# Patient Record
Sex: Female | Born: 2005 | Race: White | Hispanic: No | Marital: Single | Smoking: Never smoker
Health system: Southern US, Community
[De-identification: ages and names within clinical notes are randomized; demographics above are authoritative.]

---
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
## 2024-01-30 DIAGNOSIS — M9904 Segmental and somatic dysfunction of sacral region: Secondary | ICD-10-CM | POA: Diagnosis not present

## 2024-01-30 DIAGNOSIS — M9905 Segmental and somatic dysfunction of pelvic region: Secondary | ICD-10-CM | POA: Diagnosis not present

## 2024-01-30 DIAGNOSIS — M9903 Segmental and somatic dysfunction of lumbar region: Secondary | ICD-10-CM | POA: Diagnosis not present

## 2024-01-30 DIAGNOSIS — M9902 Segmental and somatic dysfunction of thoracic region: Secondary | ICD-10-CM | POA: Diagnosis not present

## 2024-02-02 DIAGNOSIS — M9904 Segmental and somatic dysfunction of sacral region: Secondary | ICD-10-CM | POA: Diagnosis not present

## 2024-02-02 DIAGNOSIS — M9903 Segmental and somatic dysfunction of lumbar region: Secondary | ICD-10-CM | POA: Diagnosis not present

## 2024-02-02 DIAGNOSIS — M9902 Segmental and somatic dysfunction of thoracic region: Secondary | ICD-10-CM | POA: Diagnosis not present

## 2024-02-02 DIAGNOSIS — M9905 Segmental and somatic dysfunction of pelvic region: Secondary | ICD-10-CM | POA: Diagnosis not present

## 2024-02-07 DIAGNOSIS — M9903 Segmental and somatic dysfunction of lumbar region: Secondary | ICD-10-CM | POA: Diagnosis not present

## 2024-02-07 DIAGNOSIS — M9905 Segmental and somatic dysfunction of pelvic region: Secondary | ICD-10-CM | POA: Diagnosis not present

## 2024-02-07 DIAGNOSIS — M9904 Segmental and somatic dysfunction of sacral region: Secondary | ICD-10-CM | POA: Diagnosis not present

## 2024-02-07 DIAGNOSIS — M9902 Segmental and somatic dysfunction of thoracic region: Secondary | ICD-10-CM | POA: Diagnosis not present

## 2024-02-21 DIAGNOSIS — M9902 Segmental and somatic dysfunction of thoracic region: Secondary | ICD-10-CM | POA: Diagnosis not present

## 2024-02-21 DIAGNOSIS — M9904 Segmental and somatic dysfunction of sacral region: Secondary | ICD-10-CM | POA: Diagnosis not present

## 2024-02-21 DIAGNOSIS — M9903 Segmental and somatic dysfunction of lumbar region: Secondary | ICD-10-CM | POA: Diagnosis not present

## 2024-02-21 DIAGNOSIS — M9905 Segmental and somatic dysfunction of pelvic region: Secondary | ICD-10-CM | POA: Diagnosis not present

## 2024-02-27 DIAGNOSIS — M9903 Segmental and somatic dysfunction of lumbar region: Secondary | ICD-10-CM | POA: Diagnosis not present

## 2024-02-27 DIAGNOSIS — M9902 Segmental and somatic dysfunction of thoracic region: Secondary | ICD-10-CM | POA: Diagnosis not present

## 2024-02-27 DIAGNOSIS — M9904 Segmental and somatic dysfunction of sacral region: Secondary | ICD-10-CM | POA: Diagnosis not present

## 2024-02-27 DIAGNOSIS — M9905 Segmental and somatic dysfunction of pelvic region: Secondary | ICD-10-CM | POA: Diagnosis not present

## 2024-03-06 DIAGNOSIS — M9902 Segmental and somatic dysfunction of thoracic region: Secondary | ICD-10-CM | POA: Diagnosis not present

## 2024-03-06 DIAGNOSIS — M9903 Segmental and somatic dysfunction of lumbar region: Secondary | ICD-10-CM | POA: Diagnosis not present

## 2024-03-06 DIAGNOSIS — M9904 Segmental and somatic dysfunction of sacral region: Secondary | ICD-10-CM | POA: Diagnosis not present

## 2024-03-06 DIAGNOSIS — M9905 Segmental and somatic dysfunction of pelvic region: Secondary | ICD-10-CM | POA: Diagnosis not present

## 2024-03-15 DIAGNOSIS — M9902 Segmental and somatic dysfunction of thoracic region: Secondary | ICD-10-CM | POA: Diagnosis not present

## 2024-03-15 DIAGNOSIS — M9903 Segmental and somatic dysfunction of lumbar region: Secondary | ICD-10-CM | POA: Diagnosis not present

## 2024-03-15 DIAGNOSIS — M9905 Segmental and somatic dysfunction of pelvic region: Secondary | ICD-10-CM | POA: Diagnosis not present

## 2024-03-15 DIAGNOSIS — M9904 Segmental and somatic dysfunction of sacral region: Secondary | ICD-10-CM | POA: Diagnosis not present

## 2024-03-20 DIAGNOSIS — M9903 Segmental and somatic dysfunction of lumbar region: Secondary | ICD-10-CM | POA: Diagnosis not present

## 2024-03-20 DIAGNOSIS — M9905 Segmental and somatic dysfunction of pelvic region: Secondary | ICD-10-CM | POA: Diagnosis not present

## 2024-03-20 DIAGNOSIS — M9904 Segmental and somatic dysfunction of sacral region: Secondary | ICD-10-CM | POA: Diagnosis not present

## 2024-03-20 DIAGNOSIS — M9902 Segmental and somatic dysfunction of thoracic region: Secondary | ICD-10-CM | POA: Diagnosis not present

## 2024-03-26 DIAGNOSIS — M9903 Segmental and somatic dysfunction of lumbar region: Secondary | ICD-10-CM | POA: Diagnosis not present

## 2024-03-26 DIAGNOSIS — M9904 Segmental and somatic dysfunction of sacral region: Secondary | ICD-10-CM | POA: Diagnosis not present

## 2024-03-26 DIAGNOSIS — M9905 Segmental and somatic dysfunction of pelvic region: Secondary | ICD-10-CM | POA: Diagnosis not present

## 2024-03-26 DIAGNOSIS — M9902 Segmental and somatic dysfunction of thoracic region: Secondary | ICD-10-CM | POA: Diagnosis not present

## 2024-04-09 DIAGNOSIS — R1110 Vomiting, unspecified: Secondary | ICD-10-CM | POA: Diagnosis not present

## 2024-04-17 DIAGNOSIS — Z0000 Encounter for general adult medical examination without abnormal findings: Secondary | ICD-10-CM | POA: Diagnosis not present

## 2024-04-17 DIAGNOSIS — Z111 Encounter for screening for respiratory tuberculosis: Secondary | ICD-10-CM | POA: Diagnosis not present

## 2024-04-17 DIAGNOSIS — R636 Underweight: Secondary | ICD-10-CM | POA: Diagnosis not present

## 2024-04-17 DIAGNOSIS — Z113 Encounter for screening for infections with a predominantly sexual mode of transmission: Secondary | ICD-10-CM | POA: Diagnosis not present
